# Patient Record
Sex: Male | Born: 1955 | Race: White | Hispanic: No | Marital: Single | Smoking: Never smoker
Health system: Southern US, Community
[De-identification: ages and names within clinical notes are randomized; demographics above are authoritative.]

## PROBLEM LIST (undated history)

## (undated) DIAGNOSIS — I10 Essential (primary) hypertension: Secondary | ICD-10-CM

## (undated) HISTORY — PX: TONSILLECTOMY: SUR1361

---

## 2003-12-28 HISTORY — PX: CARDIAC CATHETERIZATION: SHX172

## 2015-12-28 HISTORY — PX: CARDIAC CATHETERIZATION: SHX172

## 2018-01-18 ENCOUNTER — Other Ambulatory Visit: Payer: Self-pay | Admitting: Urology

## 2018-01-18 DIAGNOSIS — C61 Malignant neoplasm of prostate: Secondary | ICD-10-CM

## 2018-08-02 ENCOUNTER — Ambulatory Visit
Admission: RE | Admit: 2018-08-02 | Discharge: 2018-08-02 | Disposition: A | Discharge status: Home / Self Care | Payer: BLUE CROSS/BLUE SHIELD | Source: Ambulatory Visit | Attending: Urology | Admitting: Urology

## 2018-08-02 DIAGNOSIS — C61 Malignant neoplasm of prostate: Secondary | ICD-10-CM

## 2018-08-02 MED ORDER — GADOBENATE DIMEGLUMINE 529 MG/ML IV SOLN
15.0000 mL | Freq: Once | INTRAVENOUS | Status: AC | PRN
  Administered 2018-08-02: 15 mL via INTRAVENOUS

## 2019-02-27 ENCOUNTER — Encounter: Payer: Self-pay | Admitting: Medical Oncology

## 2019-02-27 ENCOUNTER — Encounter: Payer: Self-pay | Admitting: *Deleted

## 2019-03-15 ENCOUNTER — Encounter: Payer: Self-pay | Admitting: Medical Oncology

## 2019-03-16 NOTE — Progress Notes (Signed)
GU Location of Tumor / Histology: prostatic adenocarcinoma  If Prostate Cancer, Gleason Score is (3 + 4) and PSA is (3.09) on 08/02/2018. Previous PSA done 11/09/2017 was 4.26. Prostate volume: 32.3 grams.  Ronald Lloyd has been under active surveillance since December 2018  Biopsies of prostate (if applicable) revealed: (repeat)   Past/Anticipated interventions by urology, if any: prostate biopsy, prostate biopsy, referral to [facility]  Past/Anticipated interventions by medical oncology, if any: no  Weight changes, if any:   Bowel/Bladder complaints, if any:    Nausea/Vomiting, if any: no  Pain issues, if any:    SAFETY ISSUES:  Prior radiation?   Pacemaker/ICD?   Possible current pregnancy? no, male patient  Is the patient on methotrexate?   Current Complaints / other details:  63 year old male. Maternal and paternal uncle with hx of prostate ca. Single. No children.  Due to Covid 19 patient had a virtual visit and an assessment was not completed by this RN.

## 2019-03-19 ENCOUNTER — Telehealth: Payer: Self-pay | Admitting: Medical Oncology

## 2019-03-19 NOTE — Progress Notes (Signed)
Received message from [facility] patient would like to reschedule [facility] appointment to 3/24 afternoon clinic.

## 2019-03-19 NOTE — Telephone Encounter (Signed)
Spoke with Mr. Sensing about [facility] 3/24. I explained the risks of pandemic COVID-19 transmission are causing [place] to to transition some patients from clinic visits to telehealth or  telephone visits. He does not have computer access and would like to do by telephone. We discussed the format of the clinic and the physicians that he will see.  I verified his home phone number asked him to be available at 1 pm for calls. He voiced understanding.

## 2019-03-19 NOTE — Progress Notes (Signed)
I called pt to introduce myself as the Prostate Nurse Navigator and the Coordinator of the Prostate Gates and left message.  1. Referral to the clinic 03/09/19 arriving at 8:00 am.  2. I discussed the format of the clinic and the physicians he will be seeing that day.  3. I discussed where the clinic is located and how to contact me.  4. I confirmed his address and informed him I would be mailing a packet of information and forms to be completed. I asked him to bring them with him the day of his appointment.   He voiced understanding of the above. I asked him to call me if he has any questions or concerns regarding his appointments or the forms he needs to complete.

## 2019-03-19 NOTE — Telephone Encounter (Signed)
Left message requesting a return call to discuss referral to the [facility]. I explained we will be doing consults by phone due to the COVID-19 virus.

## 2019-03-20 ENCOUNTER — Ambulatory Visit
Admission: RE | Admit: 2019-03-20 | Discharge: 2019-03-20 | Disposition: A | Discharge status: Home / Self Care | Payer: BLUE CROSS/BLUE SHIELD | Source: Ambulatory Visit | Attending: Radiation Oncology | Admitting: Radiation Oncology

## 2019-03-20 ENCOUNTER — Encounter: Payer: Self-pay | Admitting: Radiation Oncology

## 2019-03-20 ENCOUNTER — Other Ambulatory Visit: Payer: Self-pay

## 2019-03-20 ENCOUNTER — Inpatient Hospital Stay: Payer: BLUE CROSS/BLUE SHIELD | Admitting: Oncology

## 2019-03-20 ENCOUNTER — Encounter: Payer: Self-pay | Admitting: Medical Oncology

## 2019-03-20 DIAGNOSIS — C61 Malignant neoplasm of prostate: Secondary | ICD-10-CM

## 2019-03-20 NOTE — Consult Note (Signed)
Multi-Disciplinary Clinic 03/20/2019    Ronald Lloyd         MRN: [id number]  PRIMARY CARE:  Mel Almond, DO  DOB: 02/02/1956, 63 year old Male  REFERRING:  Mel Almond, DO  SSN:   PROVIDER:  [name], M.D.    LOCATION:  [facility] [phone]    CC/HPI: CC: Prostate Cancer   PCP: Dr. Emelda Fear  Location of consult: Telephone consultation due to COVID-19    Mr. Ronald Lloyd is a 63 year old gentleman with a both a maternal and paternal uncle with prostate cancer who was noted to have an elevated PSA of 4.26 prompting a TRUS biopsy of the prostate on 12/02/17 confirming Gleason 3+3=6 adenocarcinoma of the prostate in 3 out of 12 biopsy cores. His medical comorbidities include a history of CAD s/p cardiac stent placement in 2004 and again in 2017 (he is on clopidigrel and ASA), GERD, hyperlipidemia and arthritis. He elected to initially proceed with active surveillance management. An MRI in August 2019 did not demonstrate any suspicious lesions or evidence of advanced disease. He underwent a confirmatory 26 core biopsy on 02/16/19 that demonstrated upgraded Gleason 3+4=7 adenocarcinoma with 10 out of 26 cores positive for malignancy. His most recent PSA was 2.99 on 02/16/19.   Family history: Maternal and paternal uncles.   Imaging studies: MRI (8/19): No EPE, SVI or LAD.   PMH: He has a history of CAD (s/p cardiac stent placement in 2004 and 2017 on ASA and clopidigrel), GERD, hyperlipidemia, and arthritis. His cardiologist is Dr. Sharyon Cable in [place], [place].  PSH: No abdominal surgeries.   TNM stage: cT1c N0 Mx  PSA: 2.99  Gleason score: 3+4=7 (grade group 2)  Biopsy (02/16/19): 10/26 cores positive  Left: L lateral apex (2 cores positive, 3+3=6, 50% and 5%), L apex (2 cores positive, 3+4=7, 60% and 60%)  Right: R lateral apex (2 cores positive, 3+3=6, 80% and 40%), R mid lateral (2 cores positive, 3+4=7 (70% ) and 3+3=6 (90%), R base (1 core  positive, 3+3=6, 5%)  TZ: 1 core positive (3+3=6, 5%)  Prostate volume: 32.3 cc   Nomogram  OC disease: 50%  EPE: 50%  SVI: 1%  LNI: 2%  PFS (5 year, 10 year): 93%, 88%   Urinary function: IPSS is 2.  Erectile function: SHIM score is 11.     ALLERGIES: No Allergies    MEDICATIONS: Aspirin 81 mg tablet,chewable  Metoprolol Tartrate  Atorvastatin Calcium  Meeizine     GU PSH: Prostate Needle Biopsy - 02/16/2019, 12/02/2017    NON-GU PSH: Cardiac Stent Placement - 2017, about 2004 Foot surgery (unspecified) Surgical Pathology, Gross And Microscopic Examination For Prostate Needle - 02/16/2019, 12/02/2017        GU PMH: Prostate Cancer - 01/10/2018      PMH Notes:   1) Prostate cancer: He was noted to have an elevated PSA of 4.26 prompting a TRUS biopsy of the prostate on 12/02/17. This confirmed Gleason 3+3=6 adenocarcinoma of the prostate with 3 out of 12 cores positive for malignancy. After numerous attempts to reach him about his results, I was finally able to reach him in January 2019 to discuss his results. He eventually elected to proceed with an initial course of active surveillance.   Family history: He has both a maternal and paternal uncle with prostate cancer.   PMH: He has a history of CAD s/p cardiac stent placement around 2004 years ago  and again in 2017 (he takes Plavix). He also has GERD, hyperlipidemia, and arthritis.  PSH: No abdominal surgeries.   Initial diagnosis: December 2018  TNM stage: cT1c Nx Mx  PSA: 4.26  Gleason score: 3+3=6  Biopsy (12/02/17): 3/12 cores positive  Left: L lateral apex (5%), L apex (20%)  Right: R lateral apex (70%)  Prostate volume: 42.7 cc  PSAD: 0.10   Baseline urinary function: IPSS is 2.  Baseline erectile function: SHIM score is 11.   Surveillance:   Aug 2019: MRI - No concerning lesions   NON-GU PMH: Arthritis Coronary Artery Disease GERD Hypercholesterolemia    FAMILY HISTORY: Heart Disease - Father    Notes: 0  children   SOCIAL HISTORY: Marital Status: Single Preferred Language: English; Ethnicity: Not Hispanic Or Latino; Race: White Current Smoking Status: Patient has never smoked.  <DIV'  Tobacco Use Assessment Completed:  Used Tobacco in last 30 days?   Does not drink anymore.  Does not drink caffeine.    REVIEW OF SYSTEMS:     GU Review Male:  Patient denies frequent urination, hard to postpone urination, burning/ pain with urination, get up at night to urinate, leakage of urine, stream starts and stops, trouble starting your streams, and have to strain to urinate .    Gastrointestinal (Lower):  Patient denies diarrhea and constipation.    Gastrointestinal (Upper):  Patient denies nausea and vomiting.    Constitutional:  Patient denies fever, night sweats, weight loss, and fatigue.    Skin:  Patient denies skin rash/ lesion and itching.    Eyes:  Patient denies blurred vision and double vision.    Ears/ Nose/ Throat:  Patient denies sore throat and sinus problems.    Hematologic/Lymphatic:  Patient denies swollen glands and easy bruising.    Cardiovascular:  Patient denies leg swelling and chest pains.    Respiratory:  Patient denies cough and shortness of breath.    Endocrine:  Patient denies excessive thirst.    Musculoskeletal:  Patient denies back pain and joint pain.    Neurological:  Patient denies headaches and dizziness.    Psychologic:  Patient denies depression and anxiety.    VITAL SIGNS: None     MULTI-SYSTEM PHYSICAL EXAMINATION:      Constitutional: Well-nourished. No physical deformities. Normally developed. Good grooming.            PAST DATA REVIEWED:   Source Of History:  Patient    02/16/19 08/02/18 11/09/17  PSA  Total PSA 2.99 ng/mL 3.09 ng/mL 4.26 ng/mL  Free PSA   0.82 ng/mL  % Free PSA   19 % PSA    PROCEDURES: None   ASSESSMENT: None   PLAN:   Document  Letter(s):  Created for Patient: Clinical Summary   Notes:  1. Prostate cancer: Due to  COVID-19 concerns, the in-person multidisciplinary clinic was canceled and we proceeded with a telephone conference. The patient was counseled about the natural history of prostate cancer and the standard treatment options that are available for prostate cancer. It was explained to him how his age and life expectancy, clinical stage, Gleason score, and PSA affect his prognosis, the decision to proceed with additional staging studies, as well as how that information influences recommended treatment strategies. We discussed the roles for active surveillance, radiation therapy, surgical therapy, androgen deprivation, as well as ablative therapy options for the treatment of prostate cancer as appropriate to his individual cancer situation. We discussed the risks and benefits of these  options with regard to their impact on cancer control and also in terms of potential adverse events, complications, and impact on quality of life particularly related to urinary and sexual function. The patient was encouraged to ask questions throughout the discussion today and all questions were answered to his stated satisfaction. In addition, the patient was provided with and/or directed to appropriate resources and literature for further education about prostate cancer and treatment options.   CC: Dr. Emelda Fear  Dr. Tyler Pita  Dr. Zola Button   After reviewing options, he is going to further consider then prior to making a final decision. He did seem to be leaning toward either a radiation seed implant or surgery. If he does proceed with surgery, he understands that he would need to proceed with cardiac clearance per his cardiologist. He will notify me once he has made a decision or if he wishes to proceed with further discussion.   This patient encounter is appropriate and reasonable under the circumstances given the patient's particular presentation at this time. The patient has been advised of the potential risks  and limitations of this mode of treatment (including, but not limited to, the absence of in-person examination) and has agreed to be treated in a remote fashion in spite of them.    Any and all of the patient's/patient's family's questions on this issue have been answered, and I have made no promises or guarantees to the patient. The patient has also been advised to contact this office for worsening conditions or problems, and seek emergency medical treatment and/or call 911 if the patient deems either necessary.

## 2019-03-20 NOTE — Progress Notes (Signed)
Radiation Oncology         [phone] ________________________________  Multidisciplinary Prostate Cancer Clinic  Initial Radiation Oncology Consultation - Conducted via telephone due to current COVID-19 concerns for limiting patient exposure  Name: Ronald Lloyd MRN: [id number]  Date: 03/20/2019  DOB: [birth year]  NI:[id number], Ronald Guadeloupe, DO  [name], MD   REFERRING PHYSICIAN: [name], MD  DIAGNOSIS: 63 y.o. gentleman with stage T1c adenocarcinoma of the prostate with a Gleason's score of 3+4 and a PSA of 2.99    ICD-10-CM   1. Malignant neoplasm of prostate ([place]) C61     HISTORY OF PRESENT ILLNESS::Ronald Lloyd is a 63 y.o. gentleman with a diagnosis of prostate cancer.  He was noted to have an elevated PSA of 4.3 in April 2018 by his primary care physician, Dr. Cordella Register.  Accordingly, he was referred for evaluation in urology by Dr. Alinda Money on 11/09/2017, and a digital rectal examination was performed at that time revealing no prostate nodules.  Repeat PSA at that time was 4.26.  The patient proceeded to transrectal ultrasound with 12 biopsies of the prostate on 12/02/2017.  Out of 12 core biopsies, 3 were positive.  The maximum Gleason score was 3+3, and this was seen in the left apex lateral, left apex, and right apex lateral.  The patient elected to proceed with active surveillance at that time.  Repeat PSA in August 2019 was 3.09. The patient underwent MRI of the prostate on 08/02/2018 which did not show any concerning lesions. He was seen for routine follow-up with Dr. Alinda Money on 08/09/2018, and a digital rectal examination was performed at that time revealing no prostate nodules. The patient proceeded to a repeat transrectal ultrasound as part of his active surveillance with extended core biopsy of the prostate on 02/16/2019.  PSA at that time was 2.99.  The prostate volume measured 32.3 cc.  Out of 24 core biopsies, 10 were positive.  The maximum Gleason score was 3+4,  and this was seen in the left apex and right mid lateral.  Additionally, there was Gleason 3+3 disease seen in the left apex lateral, right base, and right apex lateral.     The patient reviewed the biopsy results with his urologist and he has kindly been referred today to the multidisciplinary prostate cancer clinic for presentation of pathology and radiology studies in our conference for discussion of potential radiation treatment options and clinical evaluation.  PREVIOUS RADIATION THERAPY: No  PAST MEDICAL HISTORY: He has a past medical history of CAD status post cardiac stent placement in 2004 and 2017-currently maintained on aspirin and Plavix, GERD, hyperlipidemia and osteoarthritis.  His cardiologist is Dr. Sharyon Cable in [place], [place].  PAST SURGICAL HISTORY: S/p cardiac stent placement in 2004 and 2017.  Foot surgery.  FAMILY HISTORY: family history includes Prostate cancer in his maternal uncle and paternal uncle. There is a family history of heart disease in his father.  SOCIAL HISTORY:  The patient is single and lives independently at home in [place], [place].  He has never been a smoker.  He does not currently consume EtOH and denies consuming caffeine.  ALLERGIES: The patient reports no known drug allergies.  MEDICATIONS:  Aspirin 81 mg tablet daily, Metoprolol Tartrate, Atorvastatin Calcium, and Meeizine .  REVIEW OF SYSTEMS:  On review of systems, the patient reports that he is doing well overall. He denies any chest pain, shortness of breath, cough, fevers, chills, night sweats, or unintended weight changes. He denies any bowel disturbances,  and denies abdominal pain, nausea or vomiting. He denies any new musculoskeletal or joint aches or pains. He reports nocturia x1-2. He is not currently sexually active. A complete review of systems is obtained and is otherwise negative.   PHYSICAL EXAM: Deferred due to telephone consult.  Wt Readings from Last 3 Encounters:  No  data found for Wt   Temp Readings from Last 3 Encounters:  No data found for Temp   BP Readings from Last 3 Encounters:  No data found for BP   Pulse Readings from Last 3 Encounters:  No data found for Pulse     LABORATORY DATA:  No results found for: WBC, HGB, HCT, MCV, PLT No results found for: NA, K, CL, CO2 No results found for: ALT, AST, GGT, ALKPHOS, BILITOT   RADIOGRAPHY: No results found.    IMPRESSION/PLAN: 63 y.o. gentleman with Stage T1c adenocarcinoma of the prostate with a PSA of 2.99 and a Gleason score of 3+4.    We discussed the patient's workup and outlined the nature of prostate cancer in this setting. The patient's T stage, Gleason's score, and PSA put him into the favorable-intermediate risk group. Accordingly, he is eligible for a variety of potential treatment options including brachytherapy, 5.5 weeks of external radiation, or radical prostatectomy. We discussed the available radiation techniques, and focused on the details and logistics and delivery. We discussed and outlined the risks, benefits, short and long-term effects associated with radiotherapy and compared and contrasted these with prostatectomy.  He was encouraged to ask questions that were answered to his stated satisfaction.  The patient focused most of his questions and interest in robotic-assisted laparoscopic radical prostatectomy.  We discussed some of the potential advantages of surgery including surgical staging, the availability of salvage radiotherapy to the prostatic fossa, and the confidence associated with immediate biochemical response.  We discussed some of the potential proven indications for postoperative radiotherapy including positive margins, extracapsular extension, and seminal vesicle involvement. We also talked about some of the other potential findings leading to a recommendation for radiotherapy including a non-zero postoperative PSA and positive lymph nodes.     At the end of the  conversation the patient appears most interested in prostatectomy but remains undecided regarding his final treatment preference as he continues to consider brachytherapy as a potential option.  We will share this information with Dr. Alinda Money and plan to follow up with him in 2 weeks to ascertain his final preference.  We would be more than happy to conitnue to participate in his care should he opt to proceed with radiation.  This encounter was conducted by telephone. The patient has given verbal consent for this type of encounter. The time spent during this encounter was 45 minutes with 50% of that time spent in the coordination of patient care. The attendants for this meeting include Tyler Pita MD, Ashlyn Bruning PA-C, scribe [place], and patient Ronald Lloyd.  During the encounter, Tyler Pita MD, Ashlyn Bruning PA-C, and scribe Freeman Caldron were located at [facility] Radiation Oncology Department.  Patient Ronald Lloyd was located at home.      Nicholos Johns, PA-C    Tyler Pita, MD  [facility] Direct Dial: [phone]   Fax: [phone] [email or web address]   Skype   [email or web address]  This document serves as a record of services personally performed by Tyler Pita, MD and Freeman Caldron, PA-C. It was created on their behalf by Rae Lips, a  trained medical scribe. The creation of this record is based on the scribe's personal observations and the providers' statements to them. This document has been checked and approved by the attending providers.

## 2019-03-21 ENCOUNTER — Encounter: Payer: Self-pay | Admitting: General Practice

## 2019-03-21 NOTE — Progress Notes (Signed)
[place] Spiritual Care Note  LVM as follow-up from (telephonic) Prostate Multidisciplinary Clinic to introduce Patient and [facility], encouraging callback. Will also mail packet of [place] information.   [place], [place], [facility] Pager [phone] Voicemail [phone]

## 2019-03-22 DIAGNOSIS — C61 Malignant neoplasm of prostate: Secondary | ICD-10-CM | POA: Insufficient documentation

## 2019-04-03 ENCOUNTER — Telehealth: Payer: Self-pay | Admitting: Medical Oncology

## 2019-04-03 NOTE — Telephone Encounter (Signed)
Left message to follow up post PMDC. He was undecided on treatment for his prostate cancer. I asked him to call me with his decision or if he has further question regarding prostatectomy or brachytherapy.

## 2019-04-03 NOTE — Progress Notes (Signed)
                               Care Plan Summary  Name: Ronald Lloyd DOB: 2056-05-25  Your Medical Team:   Urologist -  Dr. [name], [facility]  Radiation Oncologist - Dr. Tyler Pita, [facility]   Medical Oncologist - Dr. Zola Button, [place]  Recommendations: 1) Robotic prostatectomy 2) Brachytherapy  * These recommendations are based on information available as of today's consult.      Recommendations may change depending on the results of further tests or exams.  Next Steps: 1) Consider your options and call Cira Rue, RN with your decision    When appointments need to be scheduled, you will be contacted by [facility] and/or [facility].  Questions?  Please do not hesitate to call Cira Rue, RN, BSN, OCN at [phone] any questions or concerns.  Shirlean Mylar is your Oncology Nurse Navigator and is available to assist you while you're receiving your medical care at [facility].

## 2019-04-24 ENCOUNTER — Telehealth: Payer: Self-pay | Admitting: Medical Oncology

## 2019-04-24 NOTE — Telephone Encounter (Signed)
Ronald Lloyd called and left a voicemail that he has decided on brachytherapy as treatment for his prostate cancer. Dr. Tammi Klippel and Dr. Alinda Money notified.

## 2019-05-22 ENCOUNTER — Other Ambulatory Visit: Payer: Self-pay | Admitting: Urology

## 2019-05-30 ENCOUNTER — Telehealth: Payer: Self-pay | Admitting: *Deleted

## 2019-05-30 NOTE — Telephone Encounter (Signed)
CALLED PATIENT TO INFORM OF PRE-SEED APPTS. FOR 06-28-19 AND HIS IMPLANT FOR 08-06-19, LVM FOR A RETURN CALL

## 2019-06-05 ENCOUNTER — Other Ambulatory Visit: Payer: Self-pay | Admitting: Urology

## 2019-06-05 DIAGNOSIS — C61 Malignant neoplasm of prostate: Secondary | ICD-10-CM

## 2019-06-27 ENCOUNTER — Telehealth: Payer: Self-pay | Admitting: *Deleted

## 2019-06-27 NOTE — Telephone Encounter (Signed)
Called patient to inform that pre-seed appt. and chest x-ray and EKG has been rescheduled for 07-17-19, spoke with patient and he is in agreeance with these appts.

## 2019-06-27 NOTE — Telephone Encounter (Signed)
CALLED PATIENT TO REMIND OF PRE-SEED APPTS. FOR 06-28-19, PATIENT AWARE , BUT WANTS TO RESCHEDULE FOR ANOTHER DAY

## 2019-06-28 ENCOUNTER — Ambulatory Visit: Payer: BC Managed Care – PPO | Admitting: Radiation Oncology

## 2019-06-28 ENCOUNTER — Ambulatory Visit: Payer: BC Managed Care – PPO | Admitting: Urology

## 2019-06-28 ENCOUNTER — Encounter (HOSPITAL_COMMUNITY): Payer: BC Managed Care – PPO

## 2019-07-16 ENCOUNTER — Telehealth: Payer: Self-pay | Admitting: *Deleted

## 2019-07-16 NOTE — Telephone Encounter (Signed)
CALLED PATIENT TO REMIND OF PRE-SEED APPTS. FOR 07-17-19, LVM FOR A RETURN CALL

## 2019-07-16 NOTE — Progress Notes (Signed)
  Radiation Oncology         [phone] ________________________________  Name: Ronald Lloyd MRN: [id number]  Date: 07/17/2019  DOB: Jun 20, 1956  SIMULATION AND TREATMENT PLANNING NOTE PUBIC ARCH STUDY  JK:DTOIZTI, Elta Guadeloupe, DO  [name], MD  DIAGNOSIS: 63 y.o. gentleman with stage T1c adenocarcinoma of the prostate with a Gleason's score of 3+4 and a PSA of 2.99     ICD-10-CM   1. Malignant neoplasm of prostate ([place])  C61     COMPLEX SIMULATION:  The patient presented today for evaluation for possible prostate seed implant. He was brought to the radiation planning suite and placed supine on the CT couch. A 3-dimensional image study set was obtained in upload to the planning computer. There, on each axial slice, I contoured the prostate gland. Then, using three-dimensional radiation planning tools I reconstructed the prostate in view of the structures from the transperineal needle pathway to assess for possible pubic arch interference. In doing so, I did not appreciate any pubic arch interference. Also, the patient's prostate volume was estimated based on the drawn structure. The volume was 32 cc.  Given the pubic arch appearance and prostate volume, patient remains a good candidate to proceed with prostate seed implant. Today, he freely provided informed written consent to proceed.    PLAN: The patient will undergo prostate seed implant.   ________________________________  Sheral Apley. Tammi Klippel, M.D.

## 2019-07-17 ENCOUNTER — Ambulatory Visit
Admission: RE | Admit: 2019-07-17 | Discharge: 2019-07-17 | Disposition: A | Discharge status: Home / Self Care | Payer: BC Managed Care – PPO | Source: Ambulatory Visit | Attending: Urology | Admitting: Urology

## 2019-07-17 ENCOUNTER — Ambulatory Visit (HOSPITAL_COMMUNITY)
Admission: RE | Admit: 2019-07-17 | Discharge: 2019-07-17 | Disposition: A | Discharge status: Home / Self Care | Payer: BC Managed Care – PPO | Source: Ambulatory Visit | Attending: Urology | Admitting: Urology

## 2019-07-17 ENCOUNTER — Other Ambulatory Visit: Payer: Self-pay

## 2019-07-17 ENCOUNTER — Encounter: Payer: Self-pay | Admitting: Medical Oncology

## 2019-07-17 ENCOUNTER — Encounter (HOSPITAL_COMMUNITY)
Admission: RE | Admit: 2019-07-17 | Discharge: 2019-07-17 | Disposition: A | Discharge status: Home / Self Care | Payer: BC Managed Care – PPO | Source: Ambulatory Visit | Attending: Urology | Admitting: Urology

## 2019-07-17 ENCOUNTER — Ambulatory Visit
Admission: RE | Admit: 2019-07-17 | Discharge: 2019-07-17 | Disposition: A | Discharge status: Home / Self Care | Payer: BC Managed Care – PPO | Source: Ambulatory Visit | Attending: Radiation Oncology | Admitting: Radiation Oncology

## 2019-07-17 DIAGNOSIS — C61 Malignant neoplasm of prostate: Secondary | ICD-10-CM | POA: Insufficient documentation

## 2019-07-25 NOTE — Progress Notes (Addendum)
Called pt's cardiologist office, dr Amanda Pea , [place], [place].  Request last ekg to be fax.  Also, called and lvm with pt to make lab/ covid appt.  ADDENDUM:  Received pt's last ekg from dr Sharyon Cable office via fax, place in chart.

## 2019-07-27 NOTE — H&P (Signed)
Prostate Cancer     Ronald Lloyd is a 63 year old gentleman with a both a maternal and paternal uncle with prostate cancer who was noted to have an elevated PSA of 4.26 prompting a TRUS biopsy of the prostate on 12/02/17 confirming Gleason 3+3=6 adenocarcinoma of the prostate in 3 out of 12 biopsy cores. His medical comorbidities include a history of CAD s/p cardiac stent placement in 2004 and again in 2017 (he is on clopidigrel and ASA), GERD, hyperlipidemia and arthritis. He elected to initially proceed with active surveillance management. An MRI in August 2019 did not demonstrate any suspicious lesions or evidence of advanced disease. He underwent a confirmatory 26 core biopsy on 02/16/19 that demonstrated upgraded Gleason 3+4=7 adenocarcinoma with 10 out of 26 cores positive for malignancy. His most recent PSA was 2.99 on 02/16/19.   Family history: Maternal and paternal uncles.   Imaging studies: MRI (8/19): No EPE, SVI or LAD.   PMH: He has a history of CAD (s/p cardiac stent placement in 2004 and 2017 on ASA and clopidigrel), GERD, hyperlipidemia, and arthritis. His cardiologist is Dr. Sharyon Cable in [place], [place].  PSH: No abdominal surgeries.   TNM stage: cT1c N0 Mx  PSA: 2.99  Gleason score: 3+4=7 (grade group 2)  Biopsy (02/16/19): 10/26 cores positive  Left: L lateral apex (2 cores positive, 3+3=6, 50% and 5%), L apex (2 cores positive, 3+4=7, 60% and 60%)  Right: R lateral apex (2 cores positive, 3+3=6, 80% and 40%), R mid lateral (2 cores positive, 3+4=7 (70% ) and 3+3=6 (90%), R base (1 core positive, 3+3=6, 5%)  TZ: 1 core positive (3+3=6, 5%)  Prostate volume: 32.3 cc   Nomogram  OC disease: 50%  EPE: 50%  SVI: 1%  LNI: 2%  PFS (5 year, 10 year): 93%, 88%   Urinary function: IPSS is 2.  Erectile function: SHIM score is 11.     ALLERGIES: No Allergies    MEDICATIONS: Aspirin 81 mg tablet,chewable  Metoprolol Tartrate  Atorvastatin Calcium  Meeizine     GU  PSH: Prostate Needle Biopsy - 02/16/2019, 12/02/2017    NON-GU PSH: Cardiac Stent Placement - 2017, about 2004 Foot surgery (unspecified) Surgical Pathology, Gross And Microscopic Examination For Prostate Needle - 02/16/2019, 12/02/2017    GU PMH: Prostate Cancer - 01/10/2018      PMH Notes:   1) Prostate cancer: He was noted to have an elevated PSA of 4.26 prompting a TRUS biopsy of the prostate on 12/02/17. This confirmed Gleason 3+3=6 adenocarcinoma of the prostate with 3 out of 12 cores positive for malignancy. After numerous attempts to reach him about his results, I was finally able to reach him in January 2019 to discuss his results. He eventually elected to proceed with an initial course of active surveillance.   Family history: He has both a maternal and paternal uncle with prostate cancer.   PMH: He has a history of CAD s/p cardiac stent placement around 2004 years ago and again in 2017 (he takes Plavix). He also has GERD, hyperlipidemia, and arthritis.  PSH: No abdominal surgeries.   Initial diagnosis: December 2018  TNM stage: cT1c Nx Mx  PSA: 4.26  Gleason score: 3+3=6  Biopsy (12/02/17): 3/12 cores positive  Left: L lateral apex (5%), L apex (20%)  Right: R lateral apex (70%)  Prostate volume: 42.7 cc  PSAD: 0.10   Baseline urinary function: IPSS is 2.  Baseline erectile function: SHIM score is 11.   Surveillance:  Aug 2019: MRI - No concerning lesions   NON-GU PMH: Arthritis Coronary Artery Disease GERD Hypercholesterolemia    FAMILY HISTORY: Heart Disease - Father    Notes: 0 children   SOCIAL HISTORY: Marital Status: Single Preferred Language: English; Ethnicity: Not Hispanic Or Latino; Race: White Current Smoking Status: Patient has never smoked.   Tobacco Use Assessment Completed: Used Tobacco in last 30 days? Does not drink anymore.  Does not drink caffeine.    REVIEW OF SYSTEMS:    GU Review Male:   Patient denies frequent urination, hard to  postpone urination, burning/ pain with urination, get up at night to urinate, leakage of urine, stream starts and stops, trouble starting your streams, and have to strain to urinate .  Gastrointestinal (Lower):   Patient denies diarrhea and constipation.  Gastrointestinal (Upper):   Patient denies nausea and vomiting.  Constitutional:   Patient denies fever, night sweats, weight loss, and fatigue.  Skin:   Patient denies skin rash/ lesion and itching.  Eyes:   Patient denies blurred vision and double vision.  Ears/ Nose/ Throat:   Patient denies sore throat and sinus problems.  Hematologic/Lymphatic:   Patient denies swollen glands and easy bruising.  Cardiovascular:   Patient denies leg swelling and chest pains.  Respiratory:   Patient denies cough and shortness of breath.  Endocrine:   Patient denies excessive thirst.  Musculoskeletal:   Patient denies back pain and joint pain.  Neurological:   Patient denies headaches and dizziness.  Psychologic:   Patient denies depression and anxiety.     PLAN:     1. Prostate cancer:   He has elected to proceed with a radiation seed implantation for treatment. I discussed the potential benefits and risks of the procedure, side effects of the proposed treatment, the likelihood of the patient achieving the goals of the procedure, and any potential problems that might occur during the procedure or recuperation.

## 2019-07-30 ENCOUNTER — Telehealth: Payer: Self-pay | Admitting: *Deleted

## 2019-07-30 NOTE — Telephone Encounter (Signed)
CALLED PATIENT TO REMIND OF LAB AND COVID APPT. FOR 08-02-19, LVM FOR A RETURN CALL

## 2019-08-01 ENCOUNTER — Telehealth: Payer: Self-pay | Admitting: *Deleted

## 2019-08-01 ENCOUNTER — Other Ambulatory Visit: Payer: Self-pay

## 2019-08-01 ENCOUNTER — Encounter (HOSPITAL_BASED_OUTPATIENT_CLINIC_OR_DEPARTMENT_OTHER): Payer: Self-pay | Admitting: *Deleted

## 2019-08-01 NOTE — Telephone Encounter (Signed)
Called patient to remind of lab appt. for 08-02-19 - arrival time- 2 pm @ [facility] Admitting, spoke with patient and he is aware of this appt.

## 2019-08-01 NOTE — Progress Notes (Signed)
Spoke with patient via telephone for pre op interview. NPO after MN patient to take Metoprolol with a sip of water AM of surgery. Patient verbalized understanding of using Fleets enema before coming for surgery. Arrival time 55.

## 2019-08-02 ENCOUNTER — Telehealth: Payer: Self-pay | Admitting: *Deleted

## 2019-08-02 ENCOUNTER — Encounter (HOSPITAL_COMMUNITY)
Admission: RE | Admit: 2019-08-02 | Discharge: 2019-08-02 | Disposition: A | Discharge status: Home / Self Care | Payer: BC Managed Care – PPO | Source: Ambulatory Visit | Attending: Urology | Admitting: Urology

## 2019-08-02 ENCOUNTER — Other Ambulatory Visit (HOSPITAL_COMMUNITY)
Admission: RE | Admit: 2019-08-02 | Discharge: 2019-08-02 | Disposition: A | Discharge status: Home / Self Care | Payer: BC Managed Care – PPO | Source: Ambulatory Visit | Attending: Urology | Admitting: Urology

## 2019-08-02 DIAGNOSIS — Z20828 Contact with and (suspected) exposure to other viral communicable diseases: Secondary | ICD-10-CM | POA: Diagnosis not present

## 2019-08-02 DIAGNOSIS — Z01812 Encounter for preprocedural laboratory examination: Secondary | ICD-10-CM | POA: Insufficient documentation

## 2019-08-02 LAB — COMPREHENSIVE METABOLIC PANEL
ALT: 32 U/L (ref 0–44)
AST: 24 U/L (ref 15–41)
Albumin: 3.7 g/dL (ref 3.5–5.0)
Alkaline Phosphatase: 103 U/L (ref 38–126)
Anion gap: 9 (ref 5–15)
BUN: 21 mg/dL (ref 8–23)
CO2: 27 mmol/L (ref 22–32)
Calcium: 8.9 mg/dL (ref 8.9–10.3)
Chloride: 101 mmol/L (ref 98–111)
Creatinine, Ser: 1.04 mg/dL (ref 0.61–1.24)
GFR calc Af Amer: >60 mL/min (ref >60)
GFR calc non Af Amer: >60 mL/min (ref >60)
Glucose, Bld: 163 mg/dL — ABNORMAL HIGH (ref 70–99)
Potassium: 3.6 mmol/L (ref 3.5–5.1)
Sodium: 137 mmol/L (ref 135–145)
Total Bilirubin: 0.5 mg/dL (ref 0.3–1.2)
Total Protein: 7.3 g/dL (ref 6.5–8.1)

## 2019-08-02 LAB — CBC
HCT: 41.8 % (ref 39.0–52.0)
Hemoglobin: 13.9 g/dL (ref 13.0–17.0)
MCH: 33 pg (ref 26.0–34.0)
MCHC: 33.3 g/dL (ref 30.0–36.0)
MCV: 99.3 fL (ref 80.0–100.0)
Platelets: 199 10*3/uL (ref 150–400)
RBC: 4.21 MIL/uL — ABNORMAL LOW (ref 4.22–5.81)
RDW: 13.8 % (ref 11.5–15.5)
WBC: 7.2 10*3/uL (ref 4.0–10.5)
nRBC: 0 % (ref 0.0–0.2)

## 2019-08-02 LAB — SARS CORONAVIRUS 2 (TAT 6-24 HRS): SARS Coronavirus 2: NEGATIVE

## 2019-08-02 LAB — PROTIME-INR
INR: 1 (ref 0.8–1.2)
Prothrombin Time: 13 seconds (ref 11.4–15.2)

## 2019-08-02 LAB — APTT: aPTT: 33 seconds (ref 24–36)

## 2019-08-02 NOTE — Telephone Encounter (Signed)
Called patient to remind of procedure for 08-06-19, lvm for a return call

## 2019-08-03 NOTE — Progress Notes (Signed)
Reviewed pt lab results done 08-02-2019.  Noted pt takes plavix and no clearance notated.  Called and spoke w/ coni, or scheduler for dr borden, about clearance she stated that per pt's last two office visit pt did have that he takes plavix.  Coni called and stated unable to reach pt's cardiology office , dr Sharyon Cable , they are closed.  Coni stated sthe called pt and pt stated that he had a cardiology office visit w/ dr Sharyon Cable early this week and he was given instructions by his cardiologist when to stop his plavix.  Coni stated she will call cardiologist office first thing Monday morning for that office note which should have the conversation about plavix.  Asked Coni to when she receives it please fax to [facility] fax [phone] so pre-op can placed on chart dos.

## 2019-08-06 ENCOUNTER — Ambulatory Visit (HOSPITAL_BASED_OUTPATIENT_CLINIC_OR_DEPARTMENT_OTHER): Payer: BC Managed Care – PPO | Admitting: Physician Assistant

## 2019-08-06 ENCOUNTER — Encounter (HOSPITAL_BASED_OUTPATIENT_CLINIC_OR_DEPARTMENT_OTHER)
Admission: RE | Disposition: A | Discharge status: Home / Self Care | Payer: Self-pay | Source: Ambulatory Visit | Attending: Urology

## 2019-08-06 ENCOUNTER — Ambulatory Visit (HOSPITAL_COMMUNITY): Payer: BC Managed Care – PPO

## 2019-08-06 ENCOUNTER — Encounter (HOSPITAL_BASED_OUTPATIENT_CLINIC_OR_DEPARTMENT_OTHER): Payer: Self-pay | Admitting: Anesthesiology

## 2019-08-06 ENCOUNTER — Encounter: Payer: Self-pay | Admitting: Medical Oncology

## 2019-08-06 ENCOUNTER — Ambulatory Visit (HOSPITAL_BASED_OUTPATIENT_CLINIC_OR_DEPARTMENT_OTHER)
Admission: RE | Admit: 2019-08-06 | Discharge: 2019-08-06 | Disposition: A | Discharge status: Home / Self Care | Payer: BC Managed Care – PPO | Source: Ambulatory Visit | Attending: Urology | Admitting: Urology

## 2019-08-06 ENCOUNTER — Ambulatory Visit (HOSPITAL_BASED_OUTPATIENT_CLINIC_OR_DEPARTMENT_OTHER): Payer: BC Managed Care – PPO | Admitting: Anesthesiology

## 2019-08-06 DIAGNOSIS — Z955 Presence of coronary angioplasty implant and graft: Secondary | ICD-10-CM | POA: Diagnosis not present

## 2019-08-06 DIAGNOSIS — I10 Essential (primary) hypertension: Secondary | ICD-10-CM | POA: Diagnosis not present

## 2019-08-06 DIAGNOSIS — C61 Malignant neoplasm of prostate: Secondary | ICD-10-CM | POA: Insufficient documentation

## 2019-08-06 DIAGNOSIS — Z79899 Other long term (current) drug therapy: Secondary | ICD-10-CM | POA: Insufficient documentation

## 2019-08-06 DIAGNOSIS — I2510 Atherosclerotic heart disease of native coronary artery without angina pectoris: Secondary | ICD-10-CM | POA: Diagnosis not present

## 2019-08-06 DIAGNOSIS — Z7982 Long term (current) use of aspirin: Secondary | ICD-10-CM | POA: Insufficient documentation

## 2019-08-06 DIAGNOSIS — Z8042 Family history of malignant neoplasm of prostate: Secondary | ICD-10-CM | POA: Diagnosis not present

## 2019-08-06 DIAGNOSIS — E785 Hyperlipidemia, unspecified: Secondary | ICD-10-CM | POA: Insufficient documentation

## 2019-08-06 HISTORY — PX: RADIOACTIVE SEED IMPLANT: SHX5150

## 2019-08-06 HISTORY — PX: CYSTOSCOPY: SHX5120

## 2019-08-06 HISTORY — DX: Essential (primary) hypertension: I10

## 2019-08-06 HISTORY — PX: SPACE OAR INSTILLATION: SHX6769

## 2019-08-06 SURGERY — INSERTION, RADIATION SOURCE, PROSTATE
Anesthesia: General | Site: Rectum

## 2019-08-06 MED ORDER — MIDAZOLAM HCL 2 MG/2ML IJ SOLN
INTRAMUSCULAR | Status: AC
  Filled 2019-08-06: qty 2

## 2019-08-06 MED ORDER — EPHEDRINE SULFATE 50 MG/ML IJ SOLN
INTRAMUSCULAR | Status: DC | PRN
  Administered 2019-08-06: 10 mg via INTRAVENOUS
  Administered 2019-08-06 (×2): 15 mg via INTRAVENOUS

## 2019-08-06 MED ORDER — MIDAZOLAM HCL 2 MG/2ML IJ SOLN
INTRAMUSCULAR | Status: DC | PRN
  Administered 2019-08-06: 1 mg via INTRAVENOUS

## 2019-08-06 MED ORDER — KETOROLAC TROMETHAMINE 30 MG/ML IJ SOLN
INTRAMUSCULAR | Status: AC
  Filled 2019-08-06: qty 1

## 2019-08-06 MED ORDER — SODIUM CHLORIDE (PF) 0.9 % IJ SOLN: INTRAMUSCULAR | Status: DC | PRN

## 2019-08-06 MED ORDER — LIDOCAINE 2% (20 MG/ML) 5 ML SYRINGE
INTRAMUSCULAR | Status: AC
  Filled 2019-08-06: qty 5

## 2019-08-06 MED ORDER — STERILE WATER FOR INJECTION IJ SOLN
INTRAMUSCULAR | Status: DC | PRN
  Administered 2019-08-06: 3 mL

## 2019-08-06 MED ORDER — ROCURONIUM BROMIDE 10 MG/ML (PF) SYRINGE
PREFILLED_SYRINGE | INTRAVENOUS | Status: AC
  Filled 2019-08-06: qty 10

## 2019-08-06 MED ORDER — SODIUM CHLORIDE 0.9 % IR SOLN
Status: DC | PRN
  Administered 2019-08-06: 1000 mL

## 2019-08-06 MED ORDER — CIPROFLOXACIN IN D5W 400 MG/200ML IV SOLN
400.0000 mg | INTRAVENOUS | Status: AC
  Administered 2019-08-06: 400 mg via INTRAVENOUS
  Filled 2019-08-06: qty 200

## 2019-08-06 MED ORDER — DEXAMETHASONE SODIUM PHOSPHATE 10 MG/ML IJ SOLN
INTRAMUSCULAR | Status: DC | PRN
  Administered 2019-08-06: 10 mg via INTRAVENOUS

## 2019-08-06 MED ORDER — SUGAMMADEX SODIUM 200 MG/2ML IV SOLN
INTRAVENOUS | Status: DC | PRN
  Administered 2019-08-06: 200 mg via INTRAVENOUS

## 2019-08-06 MED ORDER — SUCCINYLCHOLINE CHLORIDE 20 MG/ML IJ SOLN
INTRAMUSCULAR | Status: DC | PRN
  Administered 2019-08-06: 100 mg via INTRAVENOUS

## 2019-08-06 MED ORDER — IOHEXOL 300 MG/ML  SOLN
INTRAMUSCULAR | Status: DC | PRN
  Administered 2019-08-06: 7 mL

## 2019-08-06 MED ORDER — STERILE WATER FOR IRRIGATION IR SOLN: Status: DC | PRN

## 2019-08-06 MED ORDER — SUCCINYLCHOLINE CHLORIDE 200 MG/10ML IV SOSY
PREFILLED_SYRINGE | INTRAVENOUS | Status: AC
  Filled 2019-08-06: qty 10

## 2019-08-06 MED ORDER — PROPOFOL 10 MG/ML IV BOLUS
INTRAVENOUS | Status: AC
  Filled 2019-08-06: qty 40

## 2019-08-06 MED ORDER — EPHEDRINE 5 MG/ML INJ
INTRAVENOUS | Status: AC
  Filled 2019-08-06: qty 10

## 2019-08-06 MED ORDER — ONDANSETRON HCL 4 MG/2ML IJ SOLN
INTRAMUSCULAR | Status: AC
  Filled 2019-08-06: qty 2

## 2019-08-06 MED ORDER — TAMSULOSIN HCL 0.4 MG PO CAPS: 0.4000 mg | ORAL_CAPSULE | Freq: Every day | ORAL | 0 refills | Status: AC

## 2019-08-06 MED ORDER — ONDANSETRON HCL 4 MG/2ML IJ SOLN
INTRAMUSCULAR | Status: DC | PRN
  Administered 2019-08-06: 4 mg via INTRAVENOUS

## 2019-08-06 MED ORDER — PROPOFOL 10 MG/ML IV BOLUS
INTRAVENOUS | Status: DC | PRN
  Administered 2019-08-06: 150 mg via INTRAVENOUS
  Administered 2019-08-06: 50 mg via INTRAVENOUS

## 2019-08-06 MED ORDER — CIPROFLOXACIN IN D5W 400 MG/200ML IV SOLN
INTRAVENOUS | Status: AC
  Filled 2019-08-06: qty 200

## 2019-08-06 MED ORDER — KETOROLAC TROMETHAMINE 30 MG/ML IJ SOLN
INTRAMUSCULAR | Status: DC | PRN
  Administered 2019-08-06: 30 mg via INTRAVENOUS

## 2019-08-06 MED ORDER — DOCUSATE SODIUM 100 MG PO CAPS: 100.0000 mg | ORAL_CAPSULE | Freq: Two times a day (BID) | ORAL | 0 refills | Status: AC

## 2019-08-06 MED ORDER — LACTATED RINGERS IV SOLN
INTRAVENOUS | Status: DC
  Administered 2019-08-06 (×2): via INTRAVENOUS
  Filled 2019-08-06: qty 1000

## 2019-08-06 MED ORDER — DEXAMETHASONE SODIUM PHOSPHATE 10 MG/ML IJ SOLN
INTRAMUSCULAR | Status: AC
  Filled 2019-08-06: qty 1

## 2019-08-06 MED ORDER — FENTANYL CITRATE (PF) 100 MCG/2ML IJ SOLN
INTRAMUSCULAR | Status: AC
  Filled 2019-08-06: qty 2

## 2019-08-06 MED ORDER — FLEET ENEMA 7-19 GM/118ML RE ENEM
1.0000 | ENEMA | Freq: Once | RECTAL | Status: DC
  Filled 2019-08-06: qty 1

## 2019-08-06 MED ORDER — LIDOCAINE 2% (20 MG/ML) 5 ML SYRINGE
INTRAMUSCULAR | Status: DC | PRN
  Administered 2019-08-06: 60 mg via INTRAVENOUS

## 2019-08-06 MED ORDER — ROCURONIUM BROMIDE 100 MG/10ML IV SOLN
INTRAVENOUS | Status: DC | PRN
  Administered 2019-08-06: 40 mg via INTRAVENOUS

## 2019-08-06 MED ORDER — TRAMADOL HCL 50 MG PO TABS: 50.0000 mg | ORAL_TABLET | Freq: Four times a day (QID) | ORAL | 0 refills | Status: AC | PRN

## 2019-08-06 SURGICAL SUPPLY — 44 items
BAG URINE DRAINAGE (UROLOGICAL SUPPLIES) ×5 IMPLANT
BLADE CLIPPER SENSICLIP SURGIC (BLADE) ×5 IMPLANT
CATH FOLEY 2WAY SLVR  5CC 16FR (CATHETERS) ×2
CATH FOLEY 2WAY SLVR 5CC 16FR (CATHETERS) ×3 IMPLANT
CATH ROBINSON RED A/P 16FR (CATHETERS) IMPLANT
CATH ROBINSON RED A/P 20FR (CATHETERS) ×5 IMPLANT
CLOTH BEACON ORANGE TIMEOUT ST (SAFETY) ×5 IMPLANT
CONT SPECI 4OZ STER CLIK (MISCELLANEOUS) ×10 IMPLANT
COVER BACK TABLE 60X90IN (DRAPES) ×5 IMPLANT
COVER MAYO STAND STRL (DRAPES) ×5 IMPLANT
COVER WAND RF STERILE (DRAPES) IMPLANT
DRSG TEGADERM 4X4.75 (GAUZE/BANDAGES/DRESSINGS) ×10 IMPLANT
DRSG TEGADERM 8X12 (GAUZE/BANDAGES/DRESSINGS) ×10 IMPLANT
GAUZE SPONGE 4X4 12PLY STRL LF (GAUZE/BANDAGES/DRESSINGS) ×5 IMPLANT
GLOVE BIO SURGEON STRL SZ 6 (GLOVE) IMPLANT
GLOVE BIO SURGEON STRL SZ 6.5 (GLOVE) IMPLANT
GLOVE BIO SURGEON STRL SZ7 (GLOVE) IMPLANT
GLOVE BIO SURGEON STRL SZ7.5 (GLOVE) ×10 IMPLANT
GLOVE BIO SURGEON STRL SZ8 (GLOVE) IMPLANT
GLOVE BIO SURGEONS STRL SZ 6.5 (GLOVE)
GLOVE BIOGEL PI IND STRL 6 (GLOVE) IMPLANT
GLOVE BIOGEL PI IND STRL 6.5 (GLOVE) IMPLANT
GLOVE BIOGEL PI IND STRL 7.0 (GLOVE) IMPLANT
GLOVE BIOGEL PI IND STRL 8 (GLOVE) IMPLANT
GLOVE BIOGEL PI INDICATOR 6 (GLOVE)
GLOVE BIOGEL PI INDICATOR 6.5 (GLOVE)
GLOVE BIOGEL PI INDICATOR 7.0 (GLOVE)
GLOVE BIOGEL PI INDICATOR 8 (GLOVE)
GLOVE SURG ORTHO 8.5 STRL (GLOVE) ×5 IMPLANT
GOWN STRL REUS W/TWL LRG LVL3 (GOWN DISPOSABLE) ×5 IMPLANT
HOLDER FOLEY CATH W/STRAP (MISCELLANEOUS) IMPLANT
I SEED AgX100 ×330 IMPLANT
IMPL SPACEOAR SYSTEM 10ML (Spacer) ×3 IMPLANT
IMPLANT SPACEOAR SYSTEM 10ML (Spacer) ×5 IMPLANT
IV NS 1000ML (IV SOLUTION) ×2
IV NS 1000ML BAXH (IV SOLUTION) ×3 IMPLANT
KIT TURNOVER CYSTO (KITS) ×5 IMPLANT
MARKER SKIN DUAL TIP RULER LAB (MISCELLANEOUS) ×5 IMPLANT
PACK CYSTO (CUSTOM PROCEDURE TRAY) ×5 IMPLANT
SURGILUBE 2OZ TUBE FLIPTOP (MISCELLANEOUS) ×5 IMPLANT
SUT BONE WAX W31G (SUTURE) IMPLANT
SYR 10ML LL (SYRINGE) IMPLANT
UNDERPAD 30X30 (UNDERPADS AND DIAPERS) ×10 IMPLANT
WATER STERILE IRR 500ML POUR (IV SOLUTION) ×5 IMPLANT

## 2019-08-06 NOTE — Op Note (Signed)
Preoperative diagnosis: Clinically localized adenocarcinoma of the prostate (T1c Nx Mx)  Postoperative diagnosis: Clinically localized adenocarcinoma of the prostate  Procedure: 1) Transperineal placement of radioactive seeds into the prostate                    2) Cystoscopy                    3) Insertion of SpaceOAR hydrogel   Surgeon: Pryor Curia. M.D.  Radiation oncologist: Ledon Snare, M.D.  Anesthesia: General  EBL: Minimal  Complications: None  Indication: Ronald Lloyd is a 63 y.o. gentleman with clinically localized prostate cancer. After discussing management options for treatment, he elected to proceed with radiotherapy. He presents today for the above procedures. The potential risks, complications, alternative options, and expected recovery course have been discussed in detail with the patient and he has provided informed consent to proceed.  Description of procedure: The patient was taken to the operating room and general anesthesia was induced. He was administered preoperative antibiotics, placed in the dorsal lithotomy position, and prepped and draped in the usual sterile fashion. Next, intraoperative transrectal ultrasonography was utilized for real-time intraoperative planning by the radiation oncology team. Once the treatment plan was completed and the seed strands created, stranded iodine 125 radiation seeds were placed utilizing a brachytherapy perineal template. 33 radioactive iodine 125 seeds into the prostate through 30 catheter needles.  The brachytherapy template was then removed.  A site in the midline was selected on the perineum for placement of an 18 g needle with saline.  The needle was advanced above the rectum and below Denonvillier's fascia to the mid gland and confirmed to be in the midline on transverse imaging.  One cc of saline was injected confirming appropriate expansion of this space.  A total of 5 cc of saline was then injected to open the  space further bilaterally.  The saline syringe was then removed and the SpaceOAR hydrogel was injected with good distribution bilaterally. Position of the radiation seeds was confirmed on fluoroscopic imaging.  Flexible cystoscopy was then performed and no seeds were identified within the bladder.  No bladder tumors, stones, or other mucosal pathology was identified within the bladder. He tolerated the procedure well and without complications. He was able to be transferred to the recovery unit in satisfactory condition.  He was given a voiding trial in the PACU.

## 2019-08-06 NOTE — Progress Notes (Signed)
  Radiation Oncology         [phone] ________________________________  Name: HOLSTON OYAMA MRN: [id number]  Date: 08/06/2019  DOB: 1956-03-24       Prostate Seed Implant  XB:JYNWGNF, Elta Guadeloupe, DO  No ref. provider found  DIAGNOSIS: 63 y.o. gentleman with stage T1c adenocarcinoma of the prostate with a Gleason's score of 3+4 and a PSA of 2.99    ICD-10-CM   1. Prostate cancer ([place])  C61 DG Chest 2 View    DG Chest 2 View    PROCEDURE: Insertion of radioactive I-125 seeds into the prostate gland.  RADIATION DOSE: 145 Gy, definitive therapy.  TECHNIQUE: MASAI KIDD was brought to the operating room with the urologist. He was placed in the dorsolithotomy position. He was catheterized and a rectal tube was inserted. The perineum was shaved, prepped and draped. The ultrasound probe was then introduced into the rectum to see the prostate gland.  TREATMENT DEVICE: A needle grid was attached to the ultrasound probe stand and anchor needles were placed.  3D PLANNING: The prostate was imaged in 3D using a sagittal sweep of the prostate probe. These images were transferred to the planning computer. There, the prostate, urethra and rectum were defined on each axial reconstructed image. Then, the software created an optimized 3D plan and a few seed positions were adjusted. The quality of the plan was reviewed using [facility] information for the target and the following two organs at risk:  Urethra and Rectum.  Then the accepted plan was printed and handed off to the radiation therapist.  Under my supervision, the custom loading of the seeds and spacers was carried out and loaded into sealed vicryl sleeves.  These pre-loaded needles were then placed into the needle holder.Marland Kitchen  PROSTATE VOLUME STUDY:  Using transrectal ultrasound the volume of the prostate was verified to be 33.9 cc.  SPECIAL TREATMENT PROCEDURE/SUPERVISION AND HANDLING: The pre-loaded needles were then delivered under sagittal  guidance. A total of 30 needles were used to deposit 66 seeds in the prostate gland. The individual seed activity was 0.429 mCi.  SpaceOAR:  Yes  COMPLEX SIMULATION: At the end of the procedure, an anterior radiograph of the pelvis was obtained to document seed positioning and count. Cystoscopy was performed to check the urethra and bladder.  MICRODOSIMETRY: At the end of the procedure, the patient was emitting 0.17 mR/hr at 1 meter. Accordingly, he was considered safe for hospital discharge.  PLAN: The patient will return to the radiation oncology clinic for post implant CT dosimetry in three weeks.   ________________________________  Sheral Apley Tammi Klippel, M.D.

## 2019-08-06 NOTE — Anesthesia Postprocedure Evaluation (Signed)
Anesthesia Post Note  Patient: Ronald Lloyd  Procedure(s) Performed: RADIOACTIVE SEED IMPLANT/BRACHYTHERAPY IMPLANT/ WITH CYSTOSCOPY (N/A Prostate) SPACE OAR INSTILLATION (N/A Rectum) CYSTOSCOPY (N/A Bladder)     Patient location during evaluation: PACU Anesthesia Type: General Level of consciousness: awake and alert Pain management: pain level controlled Vital Signs Assessment: post-procedure vital signs reviewed and stable Respiratory status: spontaneous breathing, nonlabored ventilation, respiratory function stable and patient connected to nasal cannula oxygen Cardiovascular status: blood pressure returned to baseline and stable Postop Assessment: no apparent nausea or vomiting Anesthetic complications: no    Last Vitals:  Vitals:   08/06/19 1315 08/06/19 1330  BP: 108/71 115/75  Pulse: 75 76  Resp: (!) 24 18  Temp: 36.6 C   SpO2: 97% 98%    Last Pain:  Vitals:   08/06/19 1315  TempSrc:   PainSc: 0-No pain                 Danaja Lasota S

## 2019-08-06 NOTE — Transfer of Care (Signed)
Immediate Anesthesia Transfer of Care Note  Patient: Ronald Lloyd  Procedure(s) Performed: RADIOACTIVE SEED IMPLANT/BRACHYTHERAPY IMPLANT/ WITH CYSTOSCOPY (N/A Prostate) SPACE OAR INSTILLATION (N/A Rectum) CYSTOSCOPY (N/A Bladder)  Patient Location: PACU  Anesthesia Type:General  Level of Consciousness: awake, alert , oriented and patient cooperative  Airway & Oxygen Therapy: Patient Spontanous Breathing and Patient connected to nasal cannula oxygen  Post-op Assessment: Report given to RN and Post -op Vital signs reviewed and stable  Post vital signs: Reviewed and stable  Last Vitals:  Vitals Value Taken Time  BP    Temp    Pulse    Resp    SpO2      Last Pain:  Vitals:   08/06/19 0934  TempSrc: Oral  PainSc: 2       Patients Stated Pain Goal: 5 (75/79/72 8206)  Complications: No apparent anesthesia complications

## 2019-08-06 NOTE — Discharge Instructions (Addendum)
You will be prescribed tamsulosin which is a medication to help you urinate over the next month.  You should call Dr. Lynne Logan office [phone]) if you feel you cannot empty your bladder well. Also, call if you develop fever > 101.  You will also be prescribed pain medication and a stool softener to take initially after the procedure.        OK TO RESTART PLAVIX ON Wednesday EVENING  Followup with Dr. Alinda Money and your radiation oncologist as scheduled.  NO ADVIL, ALEVE, MOTRIN, IBUPROFEN UNTIL 7 PM    Brachytherapy for Prostate Cancer, Care After  This sheet gives you information about how to care for yourself after your procedure. Your health care provider may also give you more specific instructions. If you have problems or questions, contact your health care provider. What can I expect after the procedure? After the procedure, it is common to have:  Trouble passing urine.  Blood in the urine or semen.  Constipation.  Frequent feeling of an urgent need to urinate.  Bruising, swelling, and tenderness of the area behind the scrotum (perineum).  Bloating and gas.  Fatigue.  Burning or pain in the rectum.  Problems getting or keeping an erection (erectile dysfunction).  Nausea. Follow these instructions at home: Managing pain, stiffness, and swelling  If directed, apply ice to the affected area: ? Put ice in a plastic bag. ? Place a towel between your skin and the bag. ? Leave the ice on for 20 minutes, 2-3 times a day.  Try not to sit directly on the area behind the scrotum. A soft cushion can help with discomfort. Activity  Do not drive for 24 hours if you were given a medicine to help you relax (sedative).  Do not drive or use heavy machinery while taking prescription pain medicine.  Rest as told by your health care provider.  Most people can return to normal activities a few days or weeks after the procedure. Ask your health care provider what activities are  safe for you. Eating and drinking  Drink enough fluid to keep your urine clear or pale yellow.  Eat a healthy, balanced diet. This includes lean proteins, whole grains, and plenty of fruits and vegetables. General instructions  Take over-the-counter and prescription medicines only as told by your health care provider.  Keep all follow-up visits as told by your health care provider. This is important. You may still need additional treatment.  Do not take baths, swim, or use a hot tub until your health care provider approves. Shower and wash the area behind the scrotum gently.  Do not have sex for one week after the treatment, or until your health care provider approves.  If you have permanent, low-dose brachytherapy implants: ? Limit close contact with children and pregnant women for 2 months or as told by your health care provider. This is important because of the radiation that is still active in the prostate. ? You may set off radioactive sensors, such as airport screenings. Ask your health care provider for a document that explains your treatment. ? You may be instructed to use a condom during sex for the first 2 months after low-dose brachytherapy. Contact a health care provider if:  You have a fever or chills.  You do not have a bowel movement for 3-4 days after the procedure.  You have diarrhea for 3-4 days after the procedure.  You develop any new symptoms, such as problems with urinating or erectile dysfunction.  You  have abdomen (abdominal) pain.  You have more blood in your urine. Get help right away if:  You cannot urinate.  There is excessive bleeding from your rectum.  You have unusual drainage coming from your rectum.  You have severe pain in the treated area that does not go away with pain medicine.  You have severe nausea or vomiting. Summary  If you have permanent, low-dose brachytherapy implants, limit close contact with children and pregnant women for  2 months or as told by your health care provider. This is important because of the radiation that is still active in the prostate.  Talk with your health care provider about your risk of brachytherapy side effects, such as erectile dysfunction or urinary problems. Your health care provider will be able to recommend possible treatment options.  Keep all follow-up visits as told by your health care provider. This is important. You may need additional treatment. This information is not intended to replace advice given to you by your health care provider. Make sure you discuss any questions you have with your health care provider. Document Released: 01/15/2011 Document Revised: 11/25/2017 Document Reviewed: 01/14/2017 Elsevier Patient Education  [facility] Instructions  Activity: Get plenty of rest for the remainder of the day. A responsible adult should stay with you for 24 hours following the procedure.  For the next 24 hours, DO NOT: -Drive a car -Paediatric nurse -Drink alcoholic beverages -Take any medication unless instructed by your physician -Make any legal decisions or sign important papers.  Meals: Start with liquid foods such as gelatin or soup. Progress to regular foods as tolerated. Avoid greasy, spicy, heavy foods. If nausea and/or vomiting occur, drink only clear liquids until the nausea and/or vomiting subsides. Call your physician if vomiting continues.  Special Instructions/Symptoms: Your throat may feel dry or sore from the anesthesia or the breathing tube placed in your throat during surgery. If this causes discomfort, gargle with warm salt water. The discomfort should disappear within 24 hours.  If you had a scopolamine patch placed behind your ear for the management of post- operative nausea and/or vomiting:  1. The medication in the patch is effective for 72 hours, after which it should be removed.  Wrap patch in a tissue and  discard in the trash. Wash hands thoroughly with soap and water. 2. You may remove the patch earlier than 72 hours if you experience unpleasant side effects which may include dry mouth, dizziness or visual disturbances. 3. Avoid touching the patch. Wash your hands with soap and water after contact with the patch.

## 2019-08-06 NOTE — Anesthesia Procedure Notes (Deleted)
Procedure Name: Intubation Date/Time: 08/06/2019 12:11 PM Performed by: Wanita Chamberlain, CRNA Pre-anesthesia Checklist: Emergency Drugs available, Suction available and Patient being monitored Oxygen Delivery Method: Circle system utilized Preoxygenation: Pre-oxygenation with 100% oxygen Induction Type: IV induction Laryngoscope Size: Glidescope and 3 Grade View: Grade II Tube type: Parker flex tip Tube size: 7.0 mm Number of attempts: 1 Airway Equipment and Method: Rigid stylet and Video-laryngoscopy Placement Confirmation: ETT inserted through vocal cords under direct vision,  positive ETCO2,  CO2 detector and breath sounds checked- equal and bilateral Secured at: 23 cm Tube secured with: Tape Dental Injury: Teeth and Oropharynx as per pre-operative assessment and Bloody posterior oropharynx  Difficulty Due To: Difficult Airway- due to reduced neck mobility Future Recommendations: Recommend- induction with short-acting agent, and alternative techniques readily available

## 2019-08-06 NOTE — Anesthesia Procedure Notes (Addendum)
Procedure Name: Intubation Date/Time: 08/06/2019 11:56 AM Performed by: Wanita Chamberlain, CRNA Pre-anesthesia Checklist: Patient identified, Emergency Drugs available, Suction available, Patient being monitored and Timeout performed Patient Re-evaluated:Patient Re-evaluated prior to induction Oxygen Delivery Method: Circle system utilized Preoxygenation: Pre-oxygenation with 100% oxygen Induction Type: IV induction Ventilation: Mask ventilation without difficulty Laryngoscope Size: Glidescope and 3 Grade View: Grade II Tube type: Oral Number of attempts: 3 Placement Confirmation: breath sounds checked- equal and bilateral,  CO2 detector,  positive ETCO2 and ETT inserted through vocal cords under direct vision Secured at: 23 cm Tube secured with: Tape Dental Injury: Teeth and Oropharynx as per pre-operative assessment  Difficulty Due To: Difficult Airway- due to reduced neck mobility Future Recommendations: Recommend- induction with short-acting agent, and alternative techniques readily available Comments: Attempted to place #4 LMA w/o success- Could not get it to pass through pharynx.  Removed and a #3 LMA placed, however large leak so D/c'd + heme on LMA. Elected Glidescope intubation. View w/ Tori noted and difficulty passing through trachea . Pt's lips, tongue and oropharynx very dry.  BBS = No change in Sa O2 throughout good mask airway.

## 2019-08-06 NOTE — Anesthesia Preprocedure Evaluation (Addendum)
Anesthesia Evaluation  Patient identified by MRN, date of birth, ID band Patient awake    Reviewed: Allergy & Precautions, NPO status , Patient's Chart, lab work & pertinent test results  Airway Mallampati: II  TM Distance: <3 FB Neck ROM: Full    Dental no notable dental hx. (+) Teeth Intact, Dental Advisory Given,    Pulmonary neg pulmonary ROS,    Pulmonary exam normal breath sounds clear to auscultation       Cardiovascular hypertension, Pt. on medications and Pt. on home beta blockers Normal cardiovascular exam Rhythm:Regular Rate:Normal     Neuro/Psych negative neurological ROS  negative psych ROS   GI/Hepatic negative GI ROS, Neg liver ROS,   Endo/Other  negative endocrine ROS  Renal/GU negative Renal ROS  negative genitourinary   Musculoskeletal negative musculoskeletal ROS (+)   Abdominal   Peds negative pediatric ROS (+)  Hematology negative hematology ROS (+)   Anesthesia Other Findings OD artificial  TMJ dysfunction noted  Reproductive/Obstetrics negative OB ROS                          Anesthesia Physical Anesthesia Plan  ASA: II  Anesthesia Plan: General   Post-op Pain Management:    Induction: Intravenous  PONV Risk Score and Plan: 2 and Ondansetron, Dexamethasone and Treatment may vary due to age or medical condition  Airway Management Planned: LMA  Additional Equipment:   Intra-op Plan:   Post-operative Plan: Extubation in OR  Informed Consent: I have reviewed the patients History and Physical, chart, labs and discussed the procedure including the risks, benefits and alternatives for the proposed anesthesia with the patient or authorized representative who has indicated his/her understanding and acceptance.     Dental advisory given  Plan Discussed with: CRNA and Surgeon  Anesthesia Plan Comments:         Anesthesia Quick Evaluation

## 2019-08-07 ENCOUNTER — Encounter (HOSPITAL_BASED_OUTPATIENT_CLINIC_OR_DEPARTMENT_OTHER): Payer: Self-pay | Admitting: Urology

## 2019-08-10 ENCOUNTER — Telehealth: Payer: Self-pay | Admitting: *Deleted

## 2019-08-10 NOTE — Telephone Encounter (Signed)
Returned patient's phone call, lvm for a return call 

## 2019-08-14 ENCOUNTER — Telehealth: Payer: Self-pay | Admitting: *Deleted

## 2019-08-14 NOTE — Telephone Encounter (Signed)
Called patient to inform that post seed appts have been moved to 08-24-19, lvm for a return call

## 2019-08-22 ENCOUNTER — Telehealth: Payer: Self-pay | Admitting: *Deleted

## 2019-08-22 NOTE — Telephone Encounter (Signed)
Returned patient's phone call, lvm for a return call 

## 2019-08-23 ENCOUNTER — Telehealth: Payer: Self-pay | Admitting: *Deleted

## 2019-08-23 ENCOUNTER — Ambulatory Visit: Payer: BC Managed Care – PPO | Admitting: Radiation Oncology

## 2019-08-23 ENCOUNTER — Ambulatory Visit: Payer: Self-pay | Admitting: Urology

## 2019-08-23 NOTE — Telephone Encounter (Signed)
CALLED PATIENT TO REMIND OF POST SEED APPTS. FOR 08-24-19, LVM FOR A RETURN CALL

## 2019-08-24 ENCOUNTER — Ambulatory Visit
Admission: RE | Admit: 2019-08-24 | Discharge: 2019-08-24 | Disposition: A | Discharge status: Home / Self Care | Payer: BC Managed Care – PPO | Source: Ambulatory Visit | Attending: Radiation Oncology | Admitting: Radiation Oncology

## 2019-08-24 ENCOUNTER — Other Ambulatory Visit: Payer: Self-pay

## 2019-08-24 ENCOUNTER — Ambulatory Visit
Admission: RE | Admit: 2019-08-24 | Discharge: 2019-08-24 | Disposition: A | Discharge status: Home / Self Care | Payer: BC Managed Care – PPO | Source: Ambulatory Visit | Attending: Urology | Admitting: Urology

## 2019-08-24 ENCOUNTER — Encounter: Payer: Self-pay | Admitting: Urology

## 2019-08-24 VITALS — Wt 180.0 lb

## 2019-08-24 DIAGNOSIS — R3911 Hesitancy of micturition: Secondary | ICD-10-CM | POA: Diagnosis not present

## 2019-08-24 DIAGNOSIS — R350 Frequency of micturition: Secondary | ICD-10-CM | POA: Insufficient documentation

## 2019-08-24 DIAGNOSIS — Z7982 Long term (current) use of aspirin: Secondary | ICD-10-CM | POA: Diagnosis not present

## 2019-08-24 DIAGNOSIS — Z923 Personal history of irradiation: Secondary | ICD-10-CM | POA: Insufficient documentation

## 2019-08-24 DIAGNOSIS — Z79899 Other long term (current) drug therapy: Secondary | ICD-10-CM | POA: Insufficient documentation

## 2019-08-24 DIAGNOSIS — C61 Malignant neoplasm of prostate: Secondary | ICD-10-CM | POA: Diagnosis present

## 2019-08-24 NOTE — Progress Notes (Signed)
  Radiation Oncology         [phone] ________________________________  Name: Ronald Lloyd MRN: [id number]  Date: 08/24/2019  DOB: April 08, 1956  COMPLEX SIMULATION NOTE  NARRATIVE:  The patient was brought to the [facility] today following prostate seed implantation approximately one month ago.  Identity was confirmed.  All relevant records and images related to the planned course of therapy were reviewed.  Then, the patient was set-up supine.  CT images were obtained.  The CT images were loaded into the planning software.  Then the prostate and rectum were contoured.  Treatment planning then occurred.  The implanted iodine 125 seeds were identified by the physics staff for projection of radiation distribution  I have requested : 3D Simulation  I have requested a DVH of the following structures: Prostate and rectum.    ________________________________  Sheral Apley Tammi Klippel, M.D.  This document serves as a record of services personally performed by Tyler Pita, MD. It was created on his behalf by Wilburn Mylar, a trained medical scribe. The creation of this record is based on the scribe's personal observations and the provider's statements to them. This document has been checked and approved by the attending provider.

## 2019-08-24 NOTE — Progress Notes (Signed)
Ronald Lloyd is here for a follow up post-seed appointment. Patient has a MRI scheduled for 08/29/2019.  Dysuria -no burning Hematuria - No signs of hematuria Leakage- No leaking  Urgency- no issues with feeling of sense of urgency.  Urine Stream-urine stream seems normal.  Empties bladder with urination-Yes, bladder feels empty with urination.  Bowels-chronic issue with bowels.  Nocturia- x1 Appointment  With urologist is September 04, 2019

## 2019-08-24 NOTE — Progress Notes (Signed)
Radiation Oncology         [phone] ________________________________  Name: Ronald Lloyd MRN: [id number]  Date: 08/24/2019  DOB: 08/06/1956  Post-Seed Follow-Up Visit Note  CC: Emelda Fear, DO  [name], MD  Diagnosis:   63 y.o. gentleman with stage T1c adenocarcinoma of the prostate with a Gleason's score of 3+4 and a PSA of 2.99    ICD-10-CM   1. Malignant neoplasm of prostate ([place])  C61     Interval Since Last Radiation:  2 weeks, 4 days 08/06/2019:  Insertion of radioactive I-125 seeds into the prostate gland; 145 Gy, definitive/boost therapy with placement of SpaceOAR gel.  Narrative:  The patient returns today for routine follow-up.  He is complaining of only mild increased urinary frequency and urinary hesitation symptoms. He filled out a questionnaire regarding urinary function today providing and overall IPSS score of 4 characterizing his symptoms as mild with urgency and intermittency but overall feels that he empties his bladder well on voiding. He is taking Flomax as prescribed and reports a strong, steady stream.  His pre-implant score was 2. He denies any abdominal pain or bowel symptoms. He has a healthy appetite and is maintaining his weight.  He denies significant fatigue.  ALLERGIES:  has No Known Allergies.  Meds: Current Outpatient Medications  Medication Sig Dispense Refill  . aspirin EC 81 MG tablet Take 81 mg by mouth daily.    Marland Kitchen atorvastatin (LIPITOR) 40 MG tablet Take 40 mg by mouth daily.    Marland Kitchen docusate sodium (COLACE) 100 MG capsule Take 1 capsule (100 mg total) by mouth 2 (two) times daily. 30 capsule 0  . meclizine (ANTIVERT) 25 MG tablet Take 25 mg by mouth every evening.    . metoprolol tartrate (LOPRESSOR) 50 MG tablet Take 50 mg by mouth 2 (two) times daily.    . tamsulosin (FLOMAX) 0.4 MG CAPS capsule Take 1 capsule (0.4 mg total) by mouth at bedtime. 30 capsule 0  . traMADol (ULTRAM) 50 MG tablet Take 1-2 tablets (50-100 mg total) by  mouth every 6 (six) hours as needed (pain). 15 tablet 0   No current facility-administered medications for this encounter.     Physical Findings: In general this is a well appearing Caucasian gentleman in no acute distress. He's alert and oriented x4 and appropriate throughout the examination. Cardiopulmonary assessment is negative for acute distress and he exhibits normal effort.   Lab Findings: Lab Results  Component Value Date   WBC 7.2 08/02/2019   HGB 13.9 08/02/2019   HCT 41.8 08/02/2019   MCV 99.3 08/02/2019   PLT 199 08/02/2019    Radiographic Findings:  Patient underwent CT imaging in our clinic for post implant dosimetry. The CT will be reviewed by Dr. Tammi Klippel to confirm there is an adequate distribution of radioactive seeds throughout the prostate gland and ensure that there are no seeds in or near the rectum. His scheduled for prostate MRI on 08/29/2019 and those images will be fused with his CT images for further evaluation. We suspect the final radiation plan and dosimetry will show appropriate coverage of the prostate gland. He understands that we will call and inform him of any unexpected findings on further review of his imaging and dosimetry.  Impression/Plan: The patient is recovering from the effects of radiation. His urinary symptoms should gradually improve over the next 4-6 months. We talked about this today. He is encouraged by his improvement already and is otherwise pleased with his outcome. We  also talked about long-term follow-up for prostate cancer following seed implant. He understands that ongoing PSA determinations and digital rectal exams will help perform surveillance to rule out disease recurrence. He has a follow up appointment scheduled with Dr. Alinda Money on 09/04/2019. He understands what to expect with his PSA measures. Patient was also educated today about some of the long-term effects from radiation including a small risk for rectal bleeding and possibly  erectile dysfunction. We talked about some of the general management approaches to these potential complications. However, I did encourage the patient to contact our office or return at any point if he has questions or concerns related to his previous radiation and prostate cancer.    Nicholos Johns, PA-C  This document serves as a record of services personally performed by [employer], PA-C. It was created on her behalf by Wilburn Mylar, a trained medical scribe. The creation of this record is based on the scribe's personal observations and the provider's statements to them. This document has been checked and approved by the attending provider.

## 2019-08-29 ENCOUNTER — Ambulatory Visit (HOSPITAL_COMMUNITY)
Admission: RE | Admit: 2019-08-29 | Discharge: 2019-08-29 | Disposition: A | Discharge status: Home / Self Care | Payer: BC Managed Care – PPO | Source: Ambulatory Visit | Attending: Urology | Admitting: Urology

## 2019-08-29 ENCOUNTER — Other Ambulatory Visit: Payer: Self-pay

## 2019-08-29 DIAGNOSIS — C61 Malignant neoplasm of prostate: Secondary | ICD-10-CM | POA: Insufficient documentation

## 2019-09-05 ENCOUNTER — Encounter: Payer: Self-pay | Admitting: Radiation Oncology

## 2019-09-05 ENCOUNTER — Ambulatory Visit
Admission: RE | Admit: 2019-09-05 | Discharge: 2019-09-05 | Disposition: A | Discharge status: Home / Self Care | Payer: BC Managed Care – PPO | Source: Ambulatory Visit | Attending: Radiation Oncology | Admitting: Radiation Oncology

## 2019-09-05 DIAGNOSIS — C61 Malignant neoplasm of prostate: Secondary | ICD-10-CM | POA: Insufficient documentation

## 2019-09-05 NOTE — Progress Notes (Signed)
  Radiation Oncology         [phone] ________________________________  Name: Ronald Lloyd MRN: [id number]  Date: 09/05/2019  DOB: 1956/06/16  3D Planning Note   Prostate Brachytherapy Post-Implant Dosimetry  Diagnosis: 63 y.o. gentleman with stage T1c adenocarcinoma of the prostate with a Gleason's score of 3+4 and a PSA of 2.99  Narrative: On a previous date, Ronald Lloyd returned following prostate seed implantation for post implant planning. He underwent CT scan complex simulation to delineate the three-dimensional structures of the pelvis and demonstrate the radiation distribution.  Since that time, the seed localization, and complex isodose planning with dose volume histograms have now been completed.  Results:   Prostate Coverage - The dose of radiation delivered to the 90% or more of the prostate gland (D90) was 93.22% of the prescription dose. This exceeds our goal of greater than 90%. Rectal Sparing - The volume of rectal tissue receiving the prescription dose or higher was 0.0 cc. This falls under our thresholds tolerance of 1.0 cc.  Impression: The prostate seed implant appears to show adequate target coverage and appropriate rectal sparing.  Plan:  The patient will continue to follow with urology for ongoing PSA determinations. I would anticipate a high likelihood for local tumor control with minimal risk for rectal morbidity.  ________________________________  Sheral Apley Tammi Klippel, M.D.

## 2020-04-08 IMAGING — CR CHEST - 2 VIEW
2 series · 2 of 2 positions shown · non-contrast
Comparison: None.

CLINICAL DATA: Preop prostate surgery.

EXAM:
CHEST - 2 VIEW

[w chest pa]
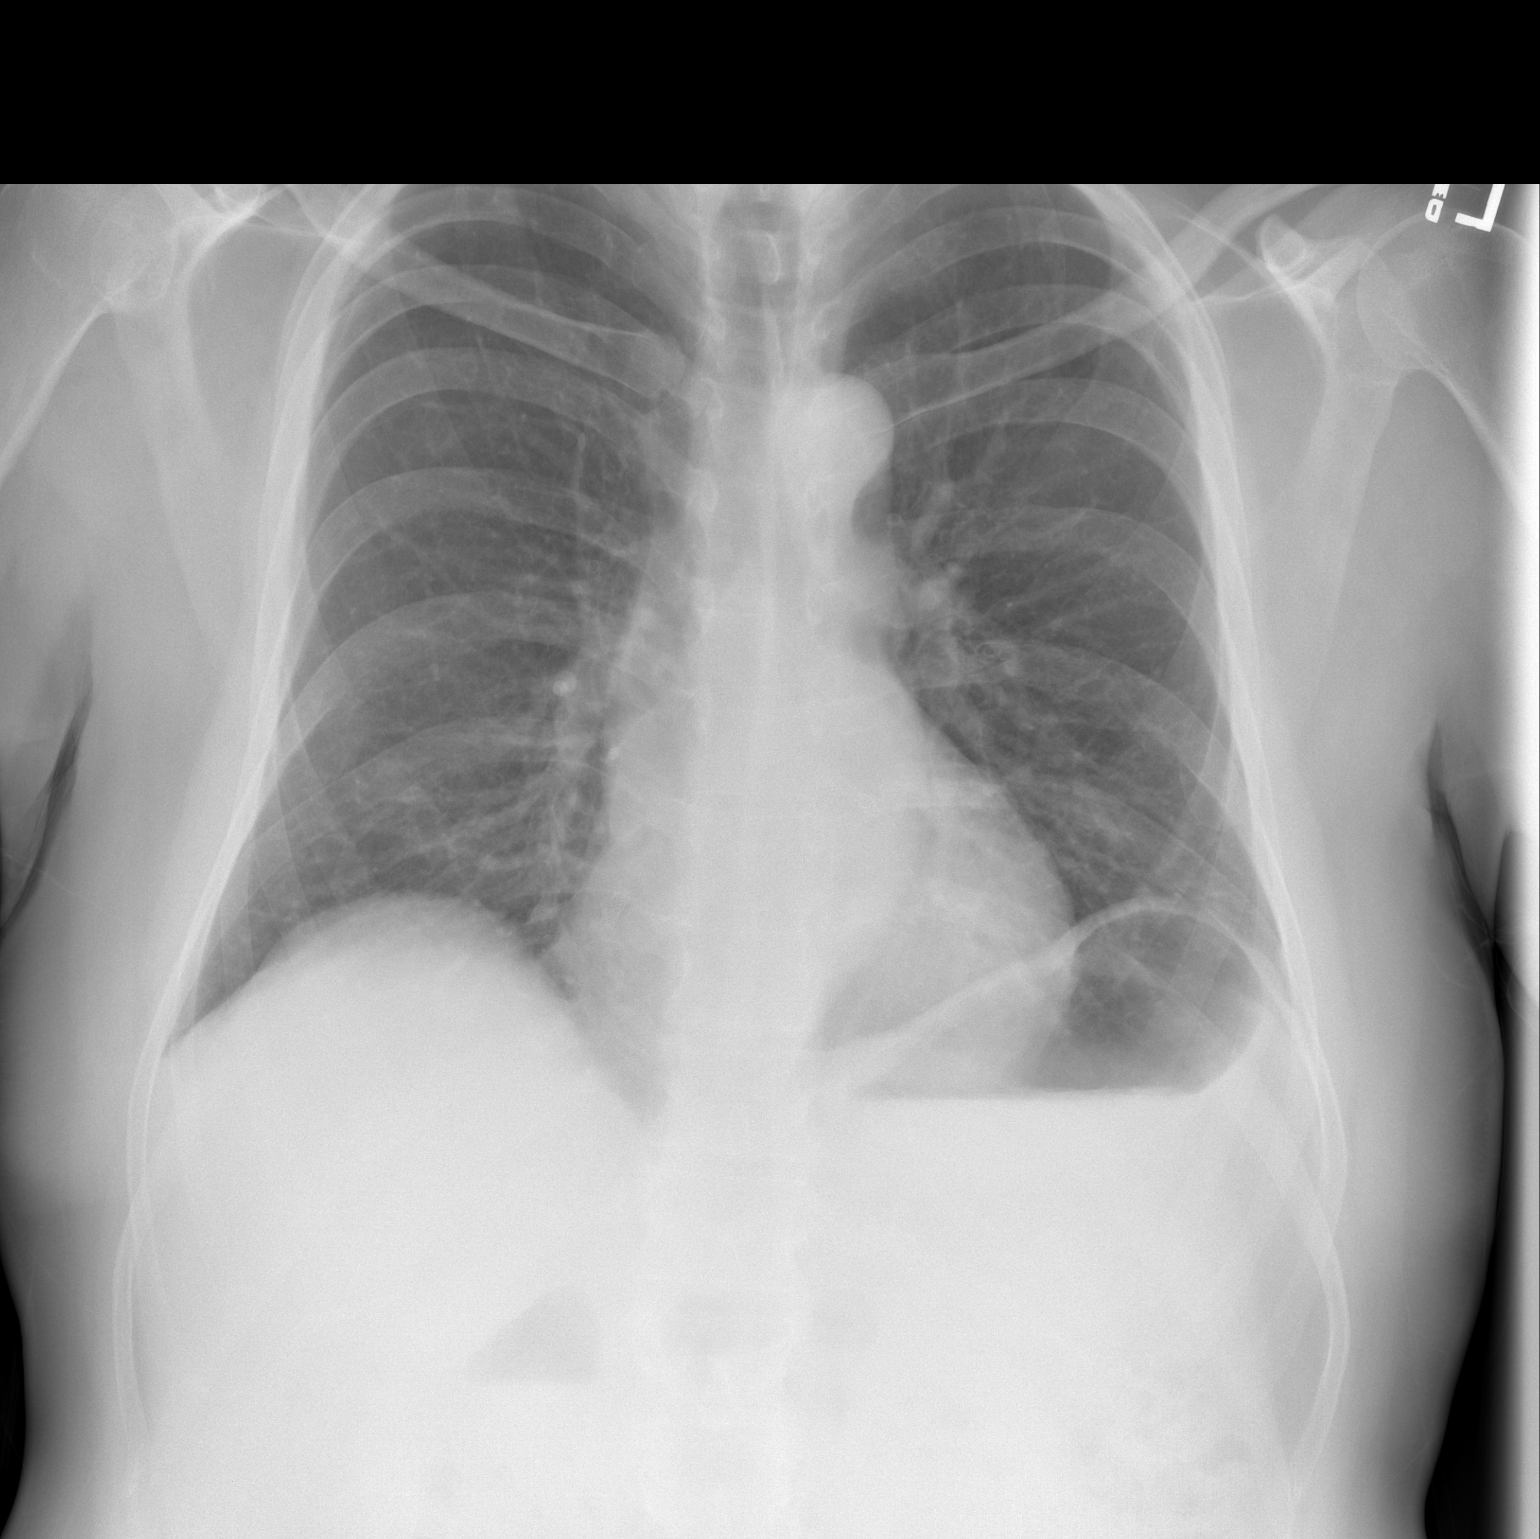

[w chest lat]
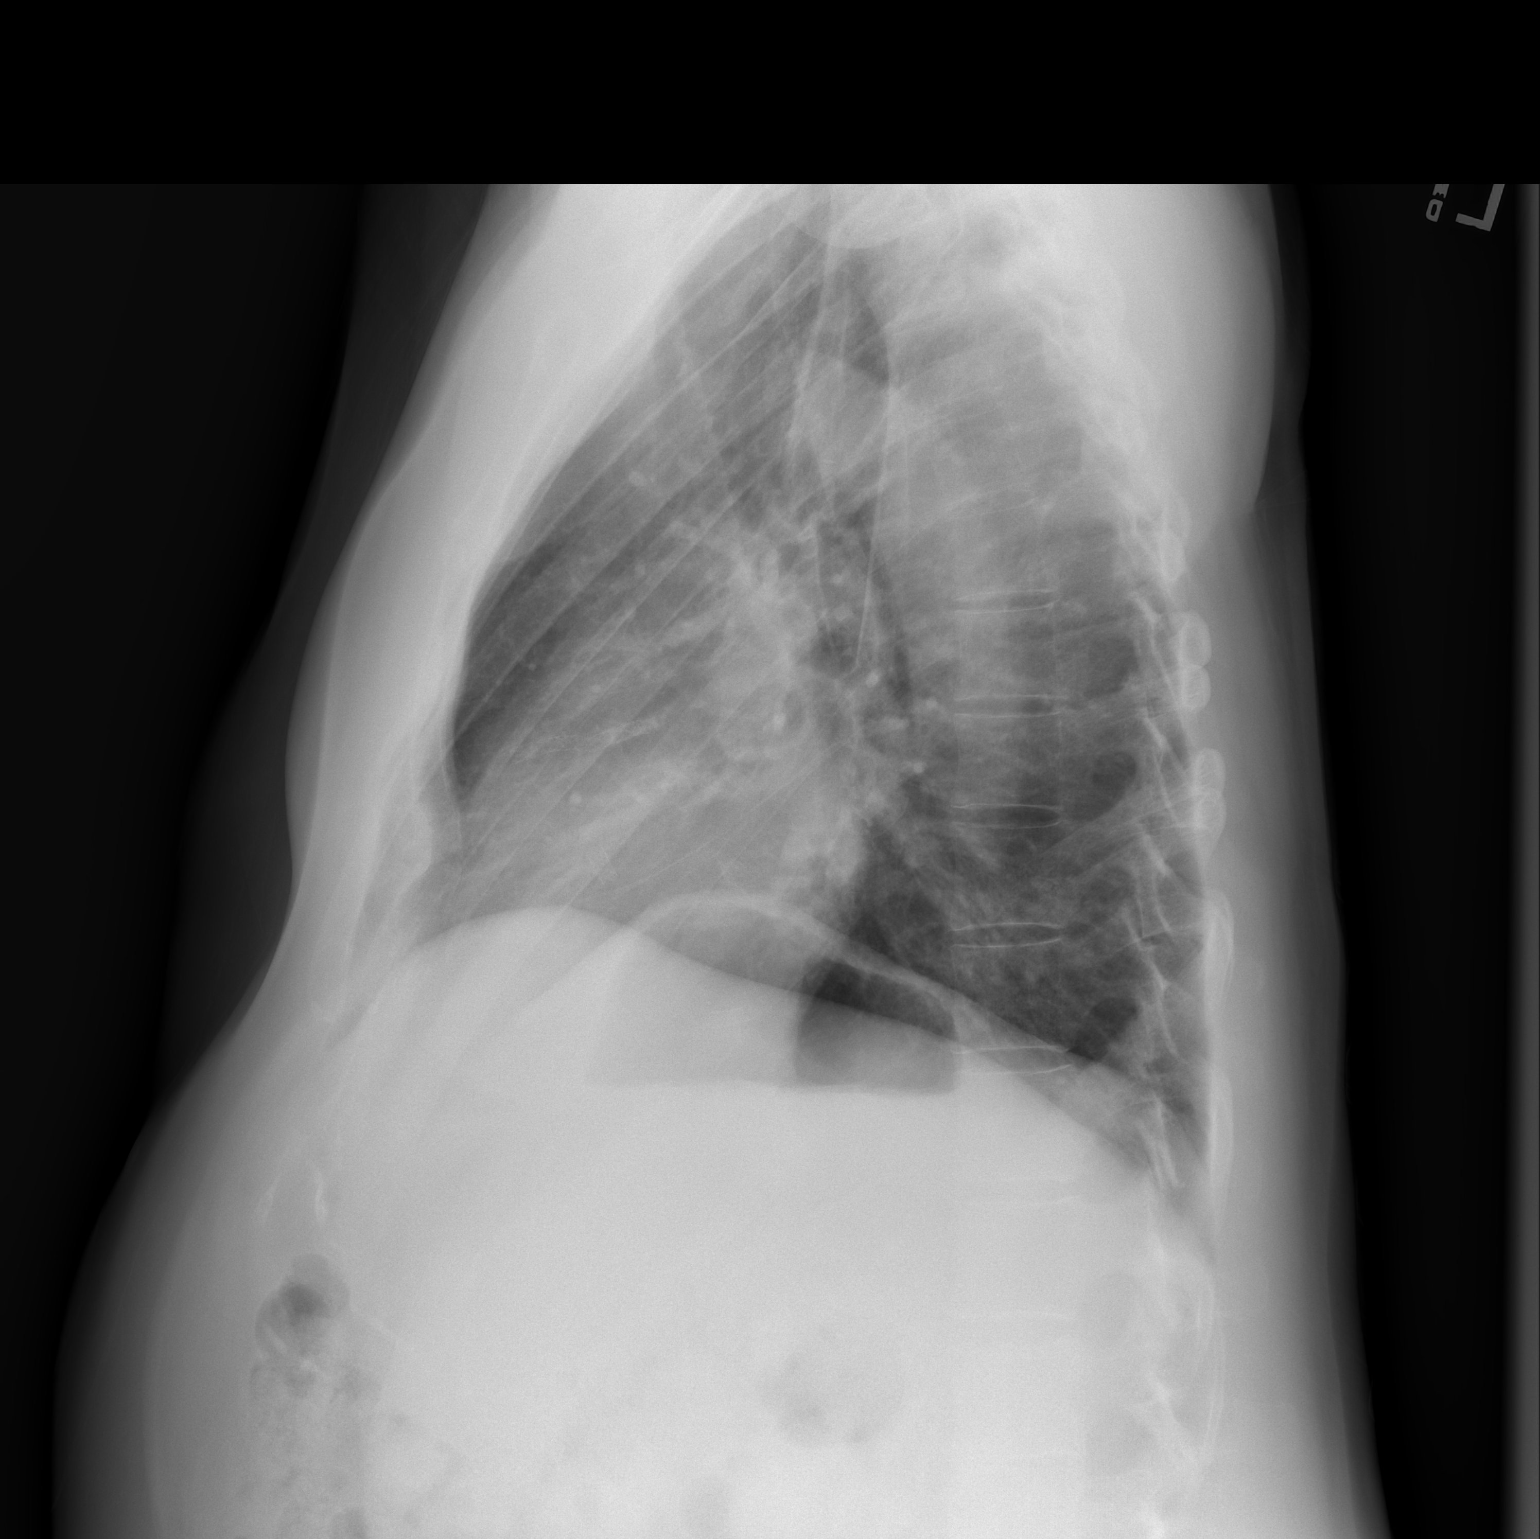

[2 of 2 positions shown; findings below may reference images not displayed]

FINDINGS: The heart size and mediastinal contours are within normal limits.
Both lungs are clear. The visualized skeletal structures are
unremarkable.
IMPRESSION: No active cardiopulmonary disease.
